# Patient Record
Sex: Male | Born: 1950 | Race: White | Hispanic: No | Smoking: Never smoker
Health system: Southern US, Community
[De-identification: ages and names within clinical notes are randomized; demographics above are authoritative.]

## PROBLEM LIST (undated history)

## (undated) DIAGNOSIS — Z91038 Other insect allergy status: Secondary | ICD-10-CM

## (undated) DIAGNOSIS — E785 Hyperlipidemia, unspecified: Secondary | ICD-10-CM

## (undated) DIAGNOSIS — I10 Essential (primary) hypertension: Secondary | ICD-10-CM

## (undated) HISTORY — PX: APPENDECTOMY: SHX54

## (undated) HISTORY — DX: Essential (primary) hypertension: I10

## (undated) HISTORY — PX: VASECTOMY: SHX75

## (undated) HISTORY — DX: Hyperlipidemia, unspecified: E78.5

## (undated) HISTORY — PX: CHOLECYSTECTOMY: SHX55

## (undated) HISTORY — DX: Other insect allergy status: Z91.038

---

## 2014-01-17 DIAGNOSIS — M5416 Radiculopathy, lumbar region: Secondary | ICD-10-CM | POA: Insufficient documentation

## 2016-11-05 HISTORY — PX: COLONOSCOPY: SHX174

## 2017-05-29 DIAGNOSIS — R0789 Other chest pain: Secondary | ICD-10-CM | POA: Diagnosis not present

## 2017-05-29 DIAGNOSIS — I10 Essential (primary) hypertension: Secondary | ICD-10-CM | POA: Diagnosis not present

## 2017-05-29 DIAGNOSIS — R0989 Other specified symptoms and signs involving the circulatory and respiratory systems: Secondary | ICD-10-CM | POA: Diagnosis not present

## 2017-05-30 DIAGNOSIS — R0789 Other chest pain: Secondary | ICD-10-CM | POA: Diagnosis not present

## 2017-05-30 DIAGNOSIS — R0989 Other specified symptoms and signs involving the circulatory and respiratory systems: Secondary | ICD-10-CM | POA: Diagnosis not present

## 2017-05-30 DIAGNOSIS — I10 Essential (primary) hypertension: Secondary | ICD-10-CM | POA: Diagnosis not present

## 2017-12-15 DIAGNOSIS — H6240 Otitis externa in other diseases classified elsewhere, unspecified ear: Secondary | ICD-10-CM | POA: Diagnosis not present

## 2017-12-15 DIAGNOSIS — B369 Superficial mycosis, unspecified: Secondary | ICD-10-CM | POA: Diagnosis not present

## 2017-12-15 DIAGNOSIS — H6091 Unspecified otitis externa, right ear: Secondary | ICD-10-CM | POA: Diagnosis not present

## 2017-12-23 DIAGNOSIS — B369 Superficial mycosis, unspecified: Secondary | ICD-10-CM | POA: Diagnosis not present

## 2017-12-23 DIAGNOSIS — H6240 Otitis externa in other diseases classified elsewhere, unspecified ear: Secondary | ICD-10-CM | POA: Diagnosis not present

## 2018-03-16 DIAGNOSIS — Z23 Encounter for immunization: Secondary | ICD-10-CM | POA: Diagnosis not present

## 2018-08-01 DIAGNOSIS — R739 Hyperglycemia, unspecified: Secondary | ICD-10-CM | POA: Diagnosis not present

## 2018-08-01 DIAGNOSIS — E782 Mixed hyperlipidemia: Secondary | ICD-10-CM | POA: Diagnosis not present

## 2018-08-01 DIAGNOSIS — I10 Essential (primary) hypertension: Secondary | ICD-10-CM | POA: Diagnosis not present

## 2018-08-01 DIAGNOSIS — Z79899 Other long term (current) drug therapy: Secondary | ICD-10-CM | POA: Diagnosis not present

## 2018-08-01 DIAGNOSIS — Z0000 Encounter for general adult medical examination without abnormal findings: Secondary | ICD-10-CM | POA: Diagnosis not present

## 2019-03-06 DIAGNOSIS — L578 Other skin changes due to chronic exposure to nonionizing radiation: Secondary | ICD-10-CM | POA: Diagnosis not present

## 2019-03-06 DIAGNOSIS — L821 Other seborrheic keratosis: Secondary | ICD-10-CM | POA: Diagnosis not present

## 2019-03-06 DIAGNOSIS — L570 Actinic keratosis: Secondary | ICD-10-CM | POA: Diagnosis not present

## 2019-03-23 DIAGNOSIS — Z23 Encounter for immunization: Secondary | ICD-10-CM | POA: Diagnosis not present

## 2019-06-19 DIAGNOSIS — L820 Inflamed seborrheic keratosis: Secondary | ICD-10-CM | POA: Diagnosis not present

## 2019-08-03 DIAGNOSIS — R739 Hyperglycemia, unspecified: Secondary | ICD-10-CM | POA: Diagnosis not present

## 2019-08-03 DIAGNOSIS — Z0000 Encounter for general adult medical examination without abnormal findings: Secondary | ICD-10-CM | POA: Diagnosis not present

## 2019-08-03 DIAGNOSIS — Z79899 Other long term (current) drug therapy: Secondary | ICD-10-CM | POA: Diagnosis not present

## 2019-08-03 DIAGNOSIS — E782 Mixed hyperlipidemia: Secondary | ICD-10-CM | POA: Diagnosis not present

## 2019-08-03 DIAGNOSIS — I10 Essential (primary) hypertension: Secondary | ICD-10-CM | POA: Diagnosis not present

## 2019-08-03 DIAGNOSIS — Z1331 Encounter for screening for depression: Secondary | ICD-10-CM | POA: Diagnosis not present

## 2019-08-03 DIAGNOSIS — Z131 Encounter for screening for diabetes mellitus: Secondary | ICD-10-CM | POA: Diagnosis not present

## 2019-08-03 DIAGNOSIS — Z9181 History of falling: Secondary | ICD-10-CM | POA: Diagnosis not present

## 2019-10-29 DIAGNOSIS — H26492 Other secondary cataract, left eye: Secondary | ICD-10-CM | POA: Diagnosis not present

## 2020-01-09 DIAGNOSIS — M5416 Radiculopathy, lumbar region: Secondary | ICD-10-CM | POA: Diagnosis not present

## 2020-01-09 DIAGNOSIS — M48061 Spinal stenosis, lumbar region without neurogenic claudication: Secondary | ICD-10-CM | POA: Diagnosis not present

## 2020-01-09 DIAGNOSIS — I10 Essential (primary) hypertension: Secondary | ICD-10-CM | POA: Diagnosis not present

## 2020-01-09 DIAGNOSIS — Z6827 Body mass index (BMI) 27.0-27.9, adult: Secondary | ICD-10-CM | POA: Diagnosis not present

## 2020-02-12 DIAGNOSIS — M545 Low back pain: Secondary | ICD-10-CM | POA: Diagnosis not present

## 2020-02-12 DIAGNOSIS — M5416 Radiculopathy, lumbar region: Secondary | ICD-10-CM | POA: Diagnosis not present

## 2020-02-17 DIAGNOSIS — Z87442 Personal history of urinary calculi: Secondary | ICD-10-CM | POA: Diagnosis not present

## 2020-02-17 DIAGNOSIS — K7689 Other specified diseases of liver: Secondary | ICD-10-CM | POA: Diagnosis not present

## 2020-02-17 DIAGNOSIS — N201 Calculus of ureter: Secondary | ICD-10-CM | POA: Diagnosis not present

## 2020-02-17 DIAGNOSIS — N281 Cyst of kidney, acquired: Secondary | ICD-10-CM | POA: Diagnosis not present

## 2020-02-17 DIAGNOSIS — R1032 Left lower quadrant pain: Secondary | ICD-10-CM | POA: Diagnosis not present

## 2020-02-17 DIAGNOSIS — N132 Hydronephrosis with renal and ureteral calculous obstruction: Secondary | ICD-10-CM | POA: Diagnosis not present

## 2020-03-17 DIAGNOSIS — M47816 Spondylosis without myelopathy or radiculopathy, lumbar region: Secondary | ICD-10-CM | POA: Diagnosis not present

## 2020-03-17 DIAGNOSIS — M48061 Spinal stenosis, lumbar region without neurogenic claudication: Secondary | ICD-10-CM | POA: Diagnosis not present

## 2020-03-20 ENCOUNTER — Other Ambulatory Visit: Payer: Self-pay | Admitting: Physical Medicine and Rehabilitation

## 2020-03-20 DIAGNOSIS — M48061 Spinal stenosis, lumbar region without neurogenic claudication: Secondary | ICD-10-CM

## 2020-03-24 ENCOUNTER — Telehealth: Payer: Self-pay

## 2020-03-24 NOTE — Telephone Encounter (Signed)
Spoke with patient to review his medications and explain the lumbar myelogram procedure to him.  He states an understanding he will be here two hours, will need a driver and will need to be on strict bedrest (explained) after the procedure for 24 hours.  He has no medications to hold.

## 2020-03-27 ENCOUNTER — Ambulatory Visit
Admission: RE | Admit: 2020-03-27 | Discharge: 2020-03-27 | Disposition: A | Discharge status: Home / Self Care | Payer: Medicare Other | Source: Ambulatory Visit | Attending: Physical Medicine and Rehabilitation | Admitting: Physical Medicine and Rehabilitation

## 2020-03-27 VITALS — BP 154/68 | HR 59

## 2020-03-27 DIAGNOSIS — M5416 Radiculopathy, lumbar region: Secondary | ICD-10-CM | POA: Diagnosis not present

## 2020-03-27 DIAGNOSIS — M5126 Other intervertebral disc displacement, lumbar region: Secondary | ICD-10-CM | POA: Diagnosis not present

## 2020-03-27 DIAGNOSIS — M48061 Spinal stenosis, lumbar region without neurogenic claudication: Secondary | ICD-10-CM

## 2020-03-27 DIAGNOSIS — M5136 Other intervertebral disc degeneration, lumbar region: Secondary | ICD-10-CM | POA: Diagnosis not present

## 2020-03-27 MED ORDER — DIAZEPAM 5 MG PO TABS
5.0000 mg | ORAL_TABLET | Freq: Once | ORAL | Status: AC
  Administered 2020-03-27: 5 mg via ORAL

## 2020-03-27 MED ORDER — IOPAMIDOL (ISOVUE-M 200) INJECTION 41%
18.0000 mL | Freq: Once | INTRAMUSCULAR | Status: AC
  Administered 2020-03-27: 18 mL via INTRATHECAL

## 2020-03-27 NOTE — Discharge Instructions (Signed)
Myelogram Discharge Instructions  1. Go home and rest quietly for the next 24 hours.  It is important to lie flat for the next 24 hours.  Get up only to go to the restroom.  You may lie in the bed or on a couch on your back, your stomach, your left side or your right side.  You may have one pillow under your head.  You may have pillows between your knees while you are on your side or under your knees while you are on your back.  2. DO NOT drive today.  Recline the seat as far back as it will go, while still wearing your seat belt, on the way home.  3. You may get up to go to the bathroom as needed.  You may sit up for 15-20 minutes to eat.  You may resume your normal diet and medications unless otherwise indicated.  Drink lots of extra fluids today and tomorrow.  4. The incidence of headache, nausea, or vomiting is about 5% (one in 20 patients).  If you develop a headache, lie flat and drink plenty of fluids until the headache goes away.  Caffeinated beverages may be helpful.  If you develop severe nausea and vomiting or a headache that does not go away with flat bed rest, call [phone].  5. You may resume normal activities after your 24 hours of bed rest is over; however, do not exert yourself strongly or do any heavy lifting tomorrow. If when you get up you have a headache when standing, go back to bed and force fluids for another 24 hours.  6. Call your physician for a follow-up appointment.  The results of your myelogram will be sent directly to your physician by the following day.  7. If you have any questions or if complications develop after you arrive home, please call [phone].  Discharge instructions have been explained to the patient.  The patient, or the person responsible for the patient, fully understands these instructions

## 2020-04-09 DIAGNOSIS — M25559 Pain in unspecified hip: Secondary | ICD-10-CM | POA: Diagnosis not present

## 2020-04-09 DIAGNOSIS — Z6827 Body mass index (BMI) 27.0-27.9, adult: Secondary | ICD-10-CM | POA: Diagnosis not present

## 2020-04-09 DIAGNOSIS — I10 Essential (primary) hypertension: Secondary | ICD-10-CM | POA: Diagnosis not present

## 2020-04-14 DIAGNOSIS — M25551 Pain in right hip: Secondary | ICD-10-CM | POA: Diagnosis not present

## 2020-04-19 DIAGNOSIS — M25551 Pain in right hip: Secondary | ICD-10-CM | POA: Diagnosis not present

## 2020-04-29 DIAGNOSIS — M25551 Pain in right hip: Secondary | ICD-10-CM | POA: Diagnosis not present

## 2020-05-12 DIAGNOSIS — M25551 Pain in right hip: Secondary | ICD-10-CM | POA: Diagnosis not present

## 2020-08-04 DIAGNOSIS — E782 Mixed hyperlipidemia: Secondary | ICD-10-CM | POA: Diagnosis not present

## 2020-08-04 DIAGNOSIS — Z79899 Other long term (current) drug therapy: Secondary | ICD-10-CM | POA: Diagnosis not present

## 2020-08-04 DIAGNOSIS — Z125 Encounter for screening for malignant neoplasm of prostate: Secondary | ICD-10-CM | POA: Diagnosis not present

## 2020-08-05 DIAGNOSIS — E782 Mixed hyperlipidemia: Secondary | ICD-10-CM | POA: Diagnosis not present

## 2020-08-05 DIAGNOSIS — Z0000 Encounter for general adult medical examination without abnormal findings: Secondary | ICD-10-CM | POA: Diagnosis not present

## 2020-08-05 DIAGNOSIS — K219 Gastro-esophageal reflux disease without esophagitis: Secondary | ICD-10-CM | POA: Diagnosis not present

## 2020-08-05 DIAGNOSIS — Z6827 Body mass index (BMI) 27.0-27.9, adult: Secondary | ICD-10-CM | POA: Diagnosis not present

## 2020-08-05 DIAGNOSIS — I10 Essential (primary) hypertension: Secondary | ICD-10-CM | POA: Diagnosis not present

## 2021-01-13 DIAGNOSIS — L821 Other seborrheic keratosis: Secondary | ICD-10-CM | POA: Diagnosis not present

## 2021-01-13 DIAGNOSIS — L578 Other skin changes due to chronic exposure to nonionizing radiation: Secondary | ICD-10-CM | POA: Diagnosis not present

## 2021-01-13 DIAGNOSIS — L570 Actinic keratosis: Secondary | ICD-10-CM | POA: Diagnosis not present

## 2021-01-13 DIAGNOSIS — L820 Inflamed seborrheic keratosis: Secondary | ICD-10-CM | POA: Diagnosis not present

## 2021-04-21 DIAGNOSIS — Z23 Encounter for immunization: Secondary | ICD-10-CM | POA: Diagnosis not present

## 2022-08-13 IMAGING — XA DG MYELOGRAPHY LUMBAR INJ LUMBOSACRAL
11 series · 11 of 11 positions shown · non-contrast
Comparison: MRI 02/12/2020

CLINICAL DATA: Low back and right radicular pain.
TECHNIQUE: Contiguous axial images were obtained through the Lumbar spine after
the intrathecal infusion of infusion. Coronal and sagittal
reconstructions were obtained of the axial image sets.

[Series 1: vasc adipose · 1 of 1 slices shown (1 of 8)]
[im 1/1]
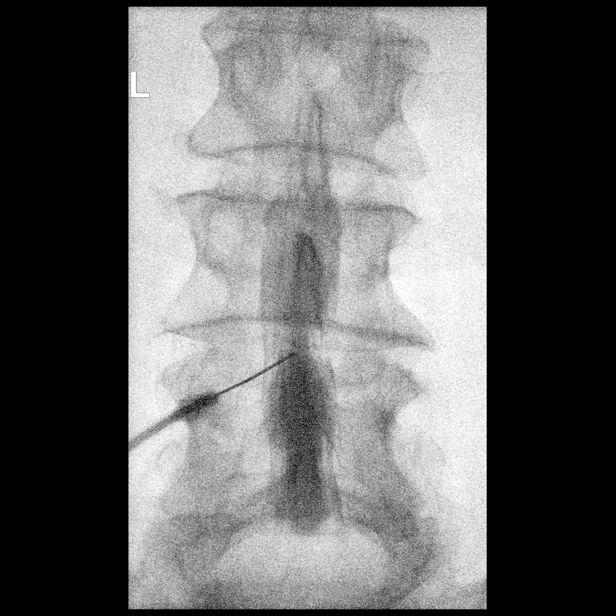

[Series 1: w lumbar spine lat · 0.15mm/px · 1 of 1 slices shown]
[im 1/1]
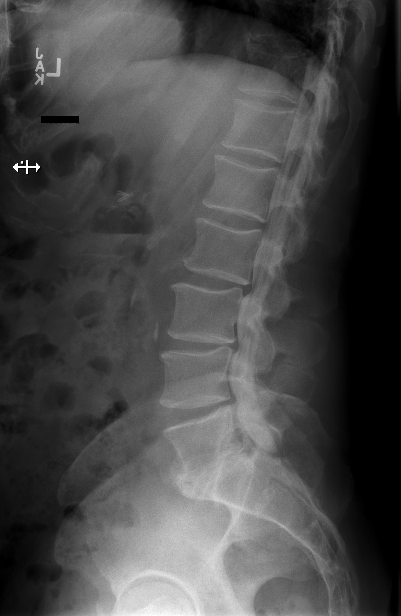

[Series 2: vasc adipose · 1 of 1 slices shown (2 of 8)]
[im 1/1]
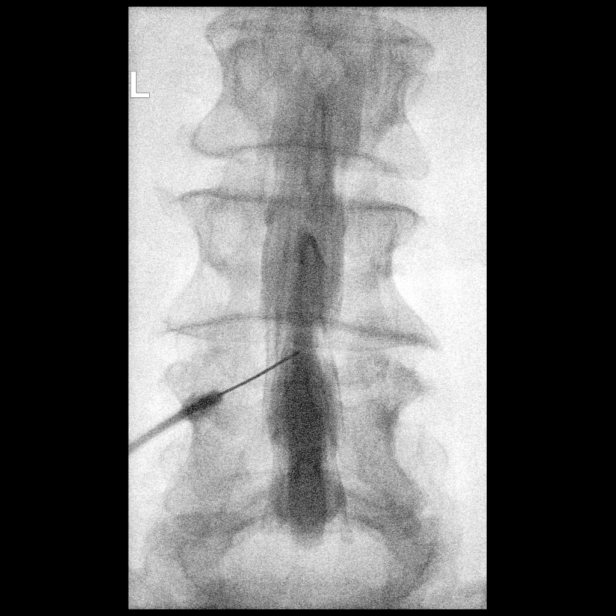

[Series 2: w lumbar spine flexion · 0.15mm/px · 1 of 1 slices shown]
[im 1/1]
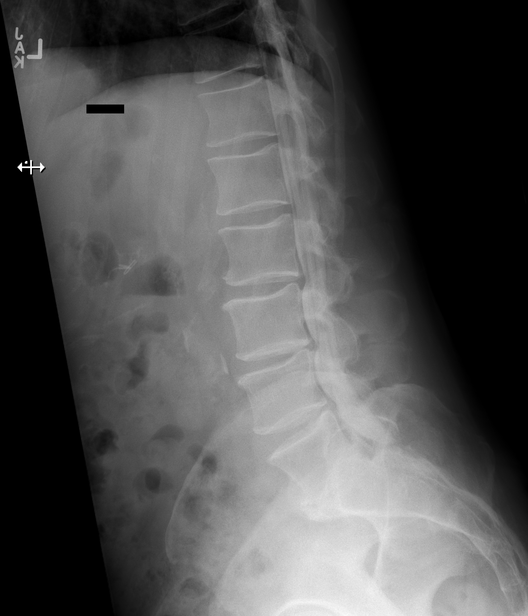

[Series 3: w lumbar spine extension · 0.15mm/px · 1 of 1 slices shown]
[im 1/1]
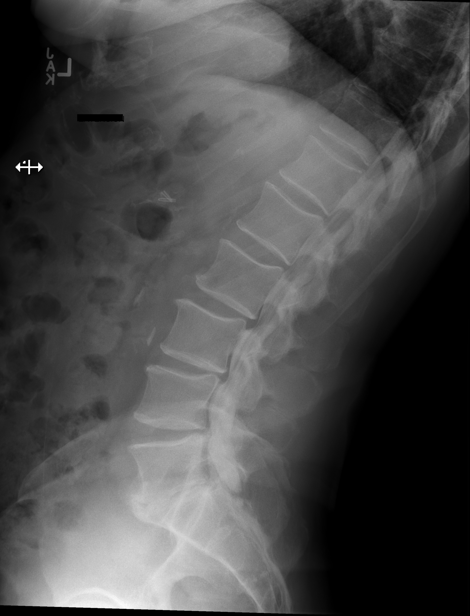

[Series 3: vasc adipose · 1 of 1 slices shown (3 of 8)]
[im 1/1]
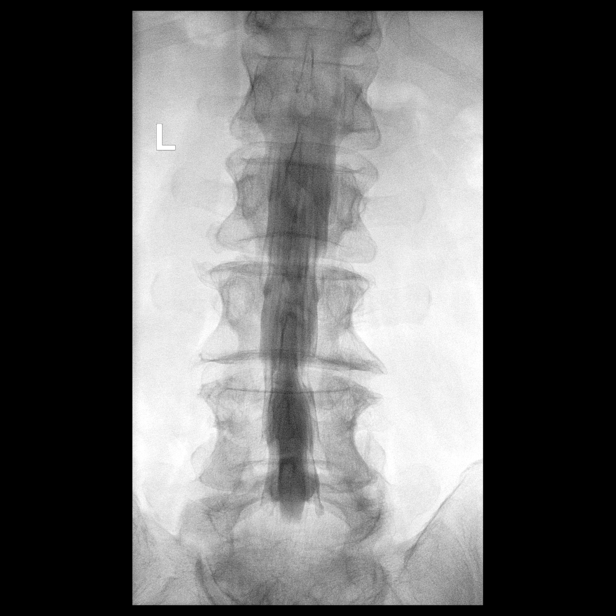

[Series 4: vasc adipose · 1 of 1 slices shown (4 of 8)]
[im 1/1]
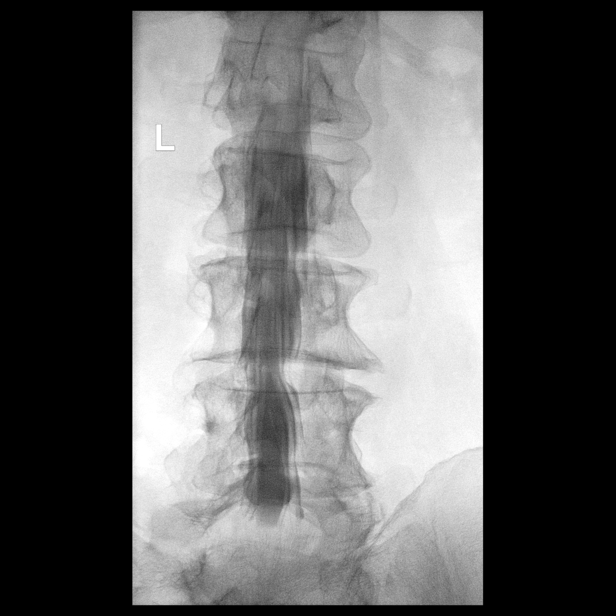

[Series 5: vasc adipose · 1 of 1 slices shown (5 of 8)]
[im 1/1]
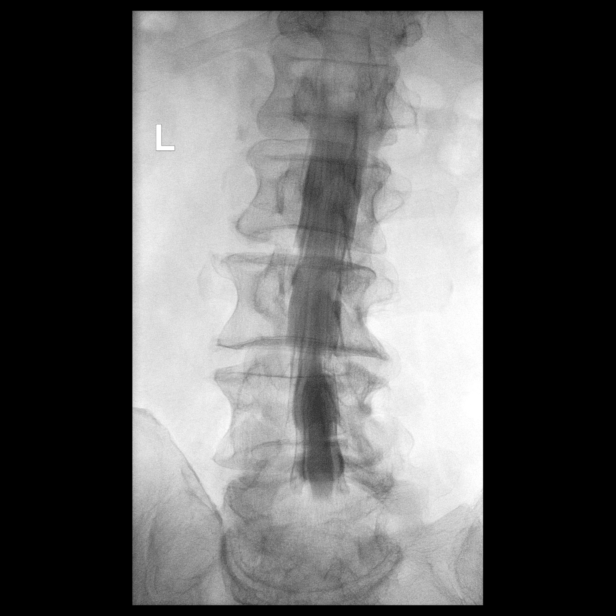

[Series 6: vasc adipose · 1 of 1 slices shown (6 of 8)]
[im 1/1]
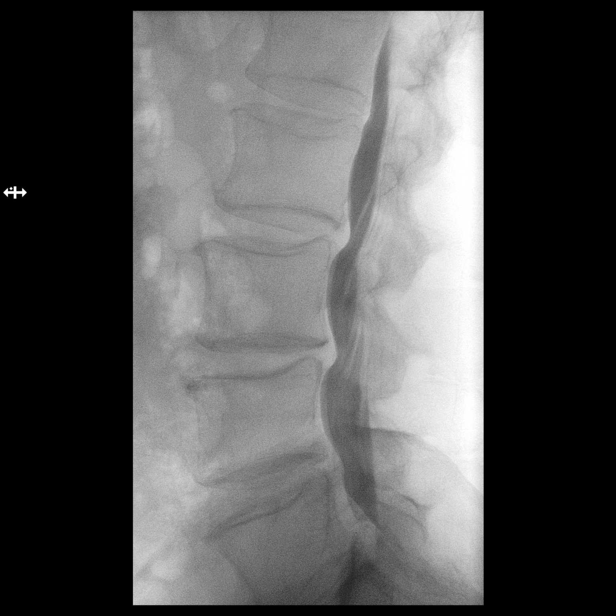

[Series 7: vasc adipose · 1 of 1 slices shown (7 of 8)]
[im 1/1]
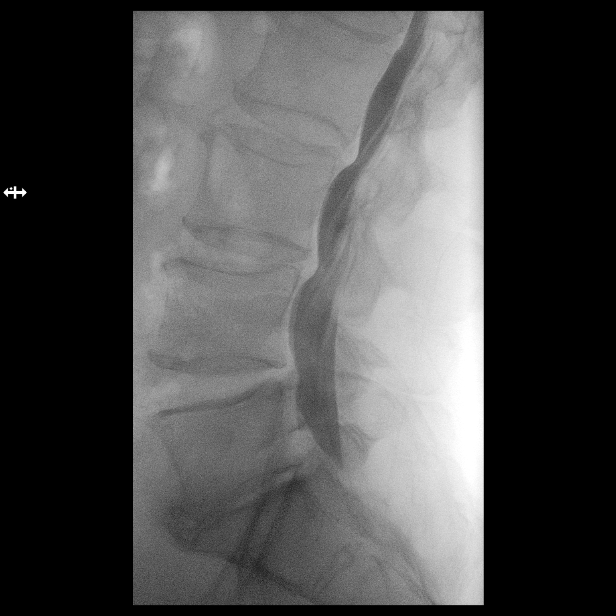

[Series 8: vasc adipose · 1 of 1 slices shown (8 of 8)]
[im 1/1]
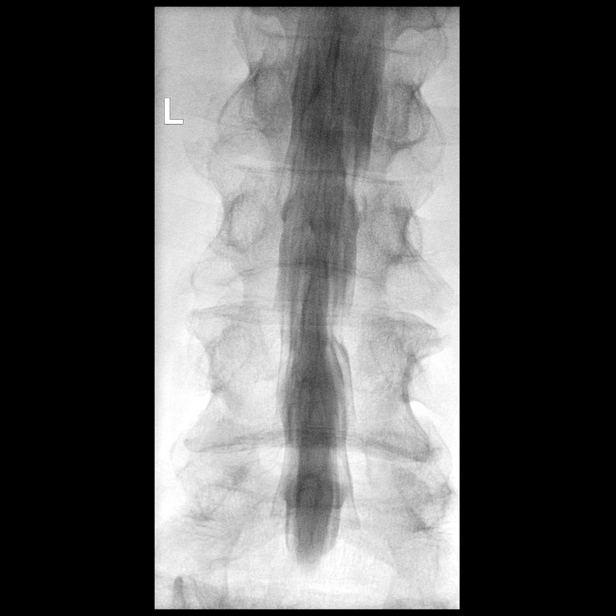

[11 of 11 positions shown; findings below may reference images not displayed]

EXAM:
LUMBAR MYELOGRAM

FLUOROSCOPY TIME:  0 minutes 42 seconds. 125.88 micro gray meter
squared

PROCEDURE:
After thorough discussion of risks and benefits of the procedure
including bleeding, infection, injury to nerves, blood vessels,
adjacent structures as well as headache and CSF leak, written and
oral informed consent was obtained. Consent was obtained by Dr. Ted
Moud. Time out form was completed.

Patient was positioned prone on the fluoroscopy table. Local
anesthesia was provided with 1% lidocaine without epinephrine after
prepped and draped in the usual sterile fashion. Puncture was
performed at L4-5 using a 3 1/2 inch 22-gauge spinal needle via left
paramedian approach. Using a single pass through the dura, the
needle was placed within the thecal sac, with return of clear CSF.
15 mL of Isovue 0-5ZZ was injected into the thecal sac, with normal
opacification of the nerve roots and cauda equina consistent with
free flow within the subarachnoid space.

I personally performed the lumbar puncture and administered the
intrathecal contrast. I also personally performed acquisition of the
myelogram images.
FINDINGS: LUMBAR MYELOGRAM FINDINGS:

Anterior extradural defects on the right at L3-4 and L4-5, more
pronounced at L3-4. Anterior extradural defects at L2-3, L3-4 and
L4-5. Standing flexion extension views do not show any subluxation.
Disc space narrowing at L5-S1 without apparent encroachment upon the
neural structures.

CT LUMBAR MYELOGRAM FINDINGS:

T11-12 through L1-2: Normal.

L2-3: Desiccation and bulging of the disc more prominent towards the
left. No apparent compressive stenosis.

L3-4: Endplate osteophytes and bulging of the disc more prominent
towards the right. Stenosis of the right lateral recess that could
compress the right L4 nerve. Mild right foraminal narrowing.

L4-5: Endplate osteophytes and bulging of the disc more prominent
towards the right. Stenosis of both lateral recesses right more than
left. Either L5 nerve could be affected, more likely the right. Bony
foraminal narrowing on the right that could affect the exiting L4
nerve.

L5-S1: Chronic disc degeneration with loss disc height. Endplate
osteophytes. No canal stenosis. Minimal foraminal encroachment on
the right by osteophytes. Mild to moderate foraminal encroachment on
the left by osteophytes.

Mild bilateral sacroiliac osteoarthritis is noted.
IMPRESSION: L2-3: Disc bulge more prominent towards the left, but without
apparent gross neural compression.

L3-4: Osteophytes and disc bulge more prominent towards the right.
Stenosis of the right lateral recess that could compress the right
L4 nerve.

L4-5: Osteophytes and disc bulge slightly more prominent towards the
right. Stenosis of both lateral recesses right more than left.
Either L5 nerve could be compressed, more likely the right. Bony
foraminal narrowing on the right could affect the exiting L4 nerve
in addition.

L5-S1: Chronic disc degeneration with loss of disc height. No canal
stenosis. Mild foraminal encroachment left more than right.

## 2022-12-10 ENCOUNTER — Encounter: Payer: Self-pay | Admitting: Gastroenterology

## 2022-12-17 ENCOUNTER — Encounter: Payer: Self-pay | Admitting: Gastroenterology

## 2023-01-12 ENCOUNTER — Ambulatory Visit: Payer: Medicare HMO

## 2023-01-12 VITALS — Ht 70.0 in | Wt 162.0 lb

## 2023-01-12 DIAGNOSIS — Z1211 Encounter for screening for malignant neoplasm of colon: Secondary | ICD-10-CM

## 2023-01-12 DIAGNOSIS — Z86010 Personal history of colonic polyps: Secondary | ICD-10-CM

## 2023-01-12 MED ORDER — NA SULFATE-K SULFATE-MG SULF 17.5-3.13-1.6 GM/177ML PO SOLN: 1.0000 | Freq: Once | ORAL | 0 refills | Status: AC

## 2023-01-12 NOTE — Progress Notes (Signed)
No egg or soy allergy known to patient  No issues known to pt with past sedation with any surgeries or procedures Patient denies ever being told they had issues or difficulty with intubation  No FH of Malignant Hyperthermia Pt is not on diet pills Pt is not on  home 02  Pt is not on blood thinners  Pt denies issues with constipation  No A fib or A flutter Have any cardiac testing pending--no  LOA: impendent   Prep: suprep  Patient's chart reviewed by Cathlyn Parsons CNRA prior to previsit and patient appropriate for the [facility].  Previsit completed and red dot placed by patient's name on their procedure day (on provider's schedule).     PV competed with patient. Prep instructions sent via mychart and home address. Goodrx coupon for [employer] provided to use for price reduction if needed.

## 2023-01-18 ENCOUNTER — Encounter: Payer: Self-pay | Admitting: Gastroenterology

## 2023-01-19 ENCOUNTER — Telehealth: Payer: Self-pay | Admitting: Gastroenterology

## 2023-01-19 NOTE — Telephone Encounter (Signed)
Inbound call from patient requesting a call back to discuss coding of 8/28 colonoscopy. Please advise, thank you.

## 2023-01-20 NOTE — Telephone Encounter (Signed)
Unable to access records Please do it as screening colonoscopy RG

## 2023-01-20 NOTE — Telephone Encounter (Signed)
That goes to previsit

## 2023-01-20 NOTE — Telephone Encounter (Signed)
Pt is requesting a call back from a nurse. Pt thinks his colonoscopy needs to be coded as preventative since he states he does not have a history of polyps.

## 2023-01-24 NOTE — Telephone Encounter (Signed)
PT needs to have procedure coded as a screen per Dr. Chales Abrahams request. Please advise

## 2023-01-24 NOTE — Telephone Encounter (Signed)
Shonpryl, Please forward this message to the pre cert employee- the patient has already completed the PV appt on 01/12/2023- ask Lady Gary if you are needed to complete additional tasks related to this Thank you Bre, PV RN

## 2023-01-27 ENCOUNTER — Encounter: Payer: Self-pay | Admitting: Gastroenterology

## 2023-01-27 NOTE — Telephone Encounter (Signed)
Per colon report in 2018 patient was seen for HOCP 1 polyp was removed at that time I am unable to see any path results. Per Dr. Chales Abrahams I have went in and updated the referral to screening due to no clarification of polyp type.

## 2023-01-27 NOTE — Addendum Note (Signed)
Addended by: Darylene Price on: 01/27/2023 02:59 PM   Modules accepted: Orders

## 2023-02-02 ENCOUNTER — Encounter: Payer: Medicare Other | Admitting: Gastroenterology

## 2023-03-13 ENCOUNTER — Encounter: Payer: Self-pay | Admitting: Certified Registered Nurse Anesthetist

## 2023-03-14 ENCOUNTER — Telehealth: Payer: Self-pay | Admitting: Gastroenterology

## 2023-03-14 NOTE — Telephone Encounter (Signed)
Updated instructions sent to patient via MyChart per his request;

## 2023-03-14 NOTE — Telephone Encounter (Signed)
Inbound call from patient stating he has not received any prep instructions for 10/14 colonoscopy. Patient requesting instructions to be sent through mychart. Please advise, thank you.

## 2023-03-17 ENCOUNTER — Encounter: Payer: Self-pay | Admitting: Gastroenterology

## 2023-03-21 ENCOUNTER — Encounter: Payer: Self-pay | Admitting: Gastroenterology

## 2023-03-21 ENCOUNTER — Ambulatory Visit: Payer: Medicare HMO | Admitting: Gastroenterology

## 2023-03-21 VITALS — BP 125/65 | HR 65 | Temp 98.2°F | Resp 11 | Ht 70.0 in | Wt 162.0 lb

## 2023-03-21 DIAGNOSIS — D122 Benign neoplasm of ascending colon: Secondary | ICD-10-CM | POA: Diagnosis not present

## 2023-03-21 DIAGNOSIS — Z1211 Encounter for screening for malignant neoplasm of colon: Secondary | ICD-10-CM

## 2023-03-21 MED ORDER — SODIUM CHLORIDE 0.9 % IV SOLN: 500.0000 mL | INTRAVENOUS | Status: DC

## 2023-03-21 NOTE — Progress Notes (Signed)
[facility] History and Physical   Primary Care Physician:  Buckner Malta, MD   Reason for Procedure:   CRC screening  Plan:    colon     HPI: Tyler Cannon is a 72 y.o. male    Past Medical History:  Diagnosis Date   Hyperlipidemia    Hypertension     Past Surgical History:  Procedure Laterality Date   APPENDECTOMY     COLONOSCOPY  11/2016   Dr. Chales Abrahams    Prior to Admission medications   Medication Sig Start Date End Date Taking? Authorizing Provider  APPLE CIDER VINEGAR PO Take 1,600 mg by mouth daily. With mother (unfiltered)    [provider]  Multiple Vitamin (DAILY VITAMINS) tablet Take 1 tablet by mouth daily.    [provider]  omega-3 acid ethyl esters (LOVAZA) 1 g capsule Take 2 capsules by mouth 2 (two) times daily. 01/01/23   [provider]  pantoprazole (PROTONIX) 40 MG tablet Take 40 mg by mouth daily as needed.    [provider]  Saw Palmetto 450 MG CAPS Take 1 capsule by mouth daily.    [provider]  simvastatin (ZOCOR) 40 MG tablet Take 20 mg by mouth daily at 6 PM.     [provider]  telmisartan (MICARDIS) 40 MG tablet Take 40 mg by mouth daily.    [provider]    Current Outpatient Medications  Medication Sig Dispense Refill   APPLE CIDER VINEGAR PO Take 1,600 mg by mouth daily. With mother (unfiltered)     Multiple Vitamin (DAILY VITAMINS) tablet Take 1 tablet by mouth daily.     omega-3 acid ethyl esters (LOVAZA) 1 g capsule Take 2 capsules by mouth 2 (two) times daily.     pantoprazole (PROTONIX) 40 MG tablet Take 40 mg by mouth daily as needed.     Saw Palmetto 450 MG CAPS Take 1 capsule by mouth daily.     simvastatin (ZOCOR) 40 MG tablet Take 20 mg by mouth daily at 6 PM.      telmisartan (MICARDIS) 40 MG tablet Take 40 mg by mouth daily.     Current Facility-Administered Medications  Medication Dose Route Frequency Provider Last Rate Last Admin   0.9 %   sodium chloride infusion  500 mL Intravenous Continuous Lynann Bologna, MD        Allergies as of 03/21/2023 - Review Complete 03/21/2023  Allergen Reaction Noted   Ibuprofen Rash 03/21/2020    Family History  Problem Relation Age of Onset   Colon cancer Neg Hx    Colon polyps Neg Hx    Esophageal cancer Neg Hx    Rectal cancer Neg Hx    Stomach cancer Neg Hx     Social History   Socioeconomic History   Marital status: Widowed    Spouse name: Not on file   Number of children: Not on file   Years of education: Not on file   Highest education level: Not on file  Occupational History   Not on file  Tobacco Use   Smoking status: Never   Smokeless tobacco: Never  Vaping Use   Vaping status: Never Used  Substance and Sexual Activity   Alcohol use: Not Currently   Drug use: Never   Sexual activity: Not on file  Other Topics Concern   Not on file  Social History Narrative   Not on file   Social Determinants of Health   Financial Resource Strain:  Not on file  Food Insecurity: Not on file  Transportation Needs: Not on file  Physical Activity: Not on file  Stress: Not on file  Social Connections: Not on file  Intimate Partner Violence: Not on file    Review of Systems: Positive for none All other review of systems negative except as mentioned in the HPI.  Physical Exam: Vital signs in last 24 hours: @VSRANGES @   General:   Alert,  Well-developed, well-nourished, pleasant and cooperative in NAD Lungs:  Clear throughout to auscultation.   Heart:  Regular rate and rhythm; no murmurs, clicks, rubs,  or gallops. Abdomen:  Soft, nontender and nondistended. Normal bowel sounds.   Neuro/Psych:  Alert and cooperative. Normal mood and affect. A and O x 3    No significant changes were identified.  The patient continues to be an appropriate candidate for the planned procedure and anesthesia.   Edman Circle, MD. [facility] 03/21/2023 3:19 PM@

## 2023-03-21 NOTE — Progress Notes (Signed)
Report given to PACU, vss 

## 2023-03-21 NOTE — Progress Notes (Signed)
Called to room to assist during endoscopic procedure.  Patient ID and intended procedure confirmed with present staff. Received instructions for my participation in the procedure from the performing physician.  

## 2023-03-21 NOTE — Patient Instructions (Addendum)
High fiber diet. Continue present medications. Await pathology results. Repeat colonoscopy for surveillance based on pathology results. Likely not needed d/t age.  Please read over handouts given                           YOU HAD AN ENDOSCOPIC PROCEDURE TODAY AT THE [facility]:   Refer to the procedure report that was given to you for any specific questions about what was found during the examination.  If the procedure report does not answer your questions, please call your gastroenterologist to clarify.  If you requested that your care partner not be given the details of your procedure findings, then the procedure report has been included in a sealed envelope for you to review at your convenience later.  YOU SHOULD EXPECT: Some feelings of bloating in the abdomen. Passage of more gas than usual.  Walking can help get rid of the air that was put into your GI tract during the procedure and reduce the bloating. If you had a lower endoscopy (such as a colonoscopy or flexible sigmoidoscopy) you may notice spotting of blood in your stool or on the toilet paper. If you underwent a bowel prep for your procedure, you may not have a normal bowel movement for a few days.  Please Note:  You might notice some irritation and congestion in your nose or some drainage.  This is from the oxygen used during your procedure.  There is no need for concern and it should clear up in a day or so.  SYMPTOMS TO REPORT IMMEDIATELY:  Following lower endoscopy (colonoscopy or flexible sigmoidoscopy):  Excessive amounts of blood in the stool  Significant tenderness or worsening of abdominal pains  Swelling of the abdomen that is new, acute  Fever of 100F or higher For urgent or emergent issues, a gastroenterologist can be reached at any hour by calling [phone]. Do not use MyChart messaging for urgent concerns.    DIET:  We do recommend a small meal at first, but then you may proceed to your regular  diet.  Drink plenty of fluids but you should avoid alcoholic beverages for 24 hours.  ACTIVITY:  You should plan to take it easy for the rest of today and you should NOT DRIVE or use heavy machinery until tomorrow (because of the sedation medicines used during the test).    FOLLOW UP: Our staff will call the number listed on your records the next business day following your procedure.  We will call around 7:15- 8:00 am to check on you and address any questions or concerns that you may have regarding the information given to you following your procedure. If we do not reach you, we will leave a message.     If any biopsies were taken you will be contacted by phone or by letter within the next 1-3 weeks.  Please call us at [phone] if you have not heard about the biopsies in 3 weeks.    SIGNATURES/CONFIDENTIALITY: You and/or your care partner have signed paperwork which will be entered into your electronic medical record.  These signatures attest to the fact that that the information above on your After Visit Summary has been reviewed and is understood.  Full responsibility of the confidentiality of this discharge information lies with you and/or your care-partner.

## 2023-03-21 NOTE — Op Note (Signed)
[facility] Patient Name: Tyler Cannon Procedure Date: 03/21/2023 3:22 PM MRN: [id number] Endoscopist: Lynann Bologna , MD, [id number] Age: 72 Referring MD:  Date of Birth: 11/14/1950 Gender: Male Account #: [id number] Procedure:                Colonoscopy Indications:              Screening for colorectal malignant neoplasm. Prev                            colon 2014. Medicines:                Monitored Anesthesia Care Procedure:                Pre-Anesthesia Assessment:                           - Prior to the procedure, a History and Physical                            was performed, and patient medications and                            allergies were reviewed. The patient's tolerance of                            previous anesthesia was also reviewed. The risks                            and benefits of the procedure and the sedation                            options and risks were discussed with the patient.                            All questions were answered, and informed consent                            was obtained. Prior Anticoagulants: The patient has                            taken no anticoagulant or antiplatelet agents. ASA                            Grade Assessment: II - A patient with mild systemic                            disease. After reviewing the risks and benefits,                            the patient was deemed in satisfactory condition to                            undergo the procedure.  After obtaining informed consent, the colonoscope                            was passed under direct vision. Throughout the                            procedure, the patient's blood pressure, pulse, and                            oxygen saturations were monitored continuously. The                            Olympus Scope [id number] was introduced through the                            anus and advanced to the the cecum, identified by                             appendiceal orifice and ileocecal valve. The                            colonoscopy was performed without difficulty. The                            patient tolerated the procedure well. The quality                            of the bowel preparation was good. The ileocecal                            valve, appendiceal orifice, and rectum were                            photographed. Scope In: 3:38:40 PM Scope Out: 3:51:31 PM Scope Withdrawal Time: 0 hours 9 minutes 4 seconds  Total Procedure Duration: 0 hours 12 minutes 51 seconds  Findings:                 A 4 mm polyp was found in the proximal ascending                            colon. The polyp was sessile. The polyp was removed                            with a cold snare. Resection and retrieval were                            complete.                           Multiple medium-mouthed diverticula were found in                            the sigmoid colon and rare in ascending colon.  Non-bleeding internal hemorrhoids were found during                            retroflexion. The hemorrhoids were small and Grade                            I (internal hemorrhoids that do not prolapse).                           The exam was otherwise without abnormality on                            direct and retroflexion views. Complications:            No immediate complications. Estimated Blood Loss:     Estimated blood loss: none. Impression:               - One 4 mm polyp in the proximal ascending colon,                            removed with a cold snare. Resected and retrieved.                           - Moderate sigmoid diverticulosis.                           - Non-bleeding internal hemorrhoids.                           - The examination was otherwise normal on direct                            and retroflexion views. Recommendation:           - Patient has a contact number  available for                            emergencies. The signs and symptoms of potential                            delayed complications were discussed with the                            patient. Return to normal activities tomorrow.                            Written discharge instructions were provided to the                            patient.                           - High fiber diet.                           - Continue present medications.                           -  Await pathology results.                           - Repeat colonoscopy for surveillance based on                            pathology results. Likely not needed d/t age.                           - The findings and recommendations were discussed                            with the patient's family. Lynann Bologna, MD 03/21/2023 3:56:25 PM This report has been signed electronically.

## 2023-03-22 ENCOUNTER — Telehealth: Payer: Self-pay

## 2023-03-22 NOTE — Telephone Encounter (Signed)
  Follow up Call-     03/21/2023    3:15 PM  Call back number  Post procedure Call Back phone  # [phone]  Permission to leave phone message Yes     Patient questions:  Do you have a fever, pain , or abdominal swelling? No. Pain Score  0 *  Have you tolerated food without any problems? Yes.    Have you been able to return to your normal activities? Yes.    Do you have any questions about your discharge instructions: Diet   No. Medications  No. Follow up visit  No.  Do you have questions or concerns about your Care? No.  Actions: * If pain score is 4 or above: No action needed, pain <4.

## 2023-03-24 ENCOUNTER — Encounter: Payer: Self-pay | Admitting: Gastroenterology

## 2023-03-24 LAB — SURGICAL PATHOLOGY

## 2023-03-28 ENCOUNTER — Ambulatory Visit (INDEPENDENT_AMBULATORY_CARE_PROVIDER_SITE_OTHER): Payer: Medicare HMO | Admitting: Allergy and Immunology

## 2023-03-28 ENCOUNTER — Encounter: Payer: Self-pay | Admitting: Allergy and Immunology

## 2023-03-28 ENCOUNTER — Telehealth: Payer: Self-pay

## 2023-03-28 VITALS — BP 138/72 | HR 64 | Resp 16 | Ht 69.3 in | Wt 180.2 lb

## 2023-03-28 DIAGNOSIS — T63481D Toxic effect of venom of other arthropod, accidental (unintentional), subsequent encounter: Secondary | ICD-10-CM

## 2023-03-28 DIAGNOSIS — T782XXD Anaphylactic shock, unspecified, subsequent encounter: Secondary | ICD-10-CM

## 2023-03-28 MED ORDER — EPINEPHRINE 0.3 MG/0.3ML IJ SOAJ: INTRAMUSCULAR | 3 refills | Status: DC

## 2023-03-28 NOTE — Patient Instructions (Signed)
  1. Fire Economist measures  2. Epi-Pen, benadryl, MD/ER evaluation for allergic reaction  3. Change lisinopril to a non-ACE Inhibitor, non-beta blocker medication (primary MD)  4. Blood - fire ant IgE, tryptase  5. Immunotherapy???  6. Obtain fall flu vaccine

## 2023-03-28 NOTE — Telephone Encounter (Signed)
Patient stating he saw you today and his blood pressure he is on is Telmisartin 40 mg once daily.
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
## 2023-03-29 ENCOUNTER — Encounter: Payer: Self-pay | Admitting: Allergy and Immunology

## 2023-03-31 ENCOUNTER — Encounter: Payer: Self-pay | Admitting: *Deleted

## 2023-04-03 LAB — TRYPTASE: Tryptase: 3.3 ug/L (ref 2.2–13.2)

## 2023-04-03 LAB — ALLERGEN FIRE ANT: I070-IgE Fire Ant (Invicta): <0.1 kU/L

## 2023-04-25 ENCOUNTER — Ambulatory Visit: Payer: Medicare HMO | Admitting: Allergy and Immunology

## 2023-04-25 DIAGNOSIS — T782XXD Anaphylactic shock, unspecified, subsequent encounter: Secondary | ICD-10-CM

## 2023-04-25 DIAGNOSIS — T63481D Toxic effect of venom of other arthropod, accidental (unintentional), subsequent encounter: Secondary | ICD-10-CM

## 2023-04-26 ENCOUNTER — Encounter: Payer: Self-pay | Admitting: Allergy and Immunology

## 2023-04-26 NOTE — Progress Notes (Signed)
 Tyler Cannon returns to this clinic for fire ant skin testing.  He demonstrated hypersensitivity against fire ant.  Recommend a course of immunotherapy.

## 2023-10-25 ENCOUNTER — Encounter: Payer: Self-pay | Admitting: Allergy and Immunology

## 2024-04-23 ENCOUNTER — Ambulatory Visit: Payer: Medicare HMO | Admitting: Allergy and Immunology

## 2024-04-23 ENCOUNTER — Encounter: Payer: Self-pay | Admitting: Allergy and Immunology

## 2024-04-23 VITALS — BP 150/80 | HR 60 | Resp 22 | Ht 69.0 in | Wt 191.8 lb

## 2024-04-23 DIAGNOSIS — T782XXD Anaphylactic shock, unspecified, subsequent encounter: Secondary | ICD-10-CM | POA: Diagnosis not present

## 2024-04-23 DIAGNOSIS — T63481D Toxic effect of venom of other arthropod, accidental (unintentional), subsequent encounter: Secondary | ICD-10-CM

## 2024-04-23 MED ORDER — EPINEPHRINE 0.3 MG/0.3ML IJ SOAJ: INTRAMUSCULAR | 3 refills | Status: AC

## 2024-04-23 NOTE — Progress Notes (Unsigned)
 [place] - [place] - [place] - [place] - [place]   Follow-up Note  Referring Provider: Clemmie Nest, MD Primary Provider: Clemmie Nest, MD Date of Office Visit: 04/23/2024  Subjective:   Ezel Vallone (DOB: 1950/10/14) is a 73 y.o. male who [facility] on 04/23/2024 in re-evaluation of the following:  HPI: Charlena returns to this clinic in evaluation of fire  ant hypersensitivity.  I last saw him in this clinic 25 April 2023.  He has not been stung by fire  ants since last seen in this clinic and thus has not had an allergic reaction.  He does have an injectable epinephrine  device.  He has elected to not engage in a course of immunotherapy.  Allergies as of 04/23/2024       Reactions   Ibuprofen Rash   Only with high doses        Medication List    APPLE CIDER VINEGAR PO Take 1,600 mg by mouth daily. With mother (unfiltered)   Daily Vitamins tablet Take 1 tablet by mouth daily.   EPINEPHrine  0.3 mg/0.3 mL Soaj injection Commonly known as: EpiPen  2-Pak Use as directed for life-threatening allergic reaction.   omega-3 acid ethyl esters 1 g capsule Commonly known as: LOVAZA Take 2 capsules by mouth 2 (two) times daily.   pantoprazole 40 MG tablet Commonly known as: PROTONIX Take 40 mg by mouth daily as needed.   Saw Palmetto 450 MG Caps Take 1 capsule by mouth daily.   simvastatin 20 MG tablet Commonly known as: ZOCOR Take 20 mg by mouth at bedtime.   telmisartan 40 MG tablet Commonly known as: MICARDIS Take 40 mg by mouth daily.    Past Medical History:  Diagnosis Date   Hymenoptera allergy     Fire  Ant   Hyperlipidemia    Hypertension     Past Surgical History:  Procedure Laterality Date   APPENDECTOMY     CHOLECYSTECTOMY     COLONOSCOPY  11/2016   Dr. Charlanne   VASECTOMY      Review of systems negative except as noted in HPI / PMHx or noted below:  Review of Systems  Constitutional: Negative.    HENT: Negative.    Eyes: Negative.   Respiratory: Negative.    Cardiovascular: Negative.   Gastrointestinal: Negative.   Genitourinary: Negative.   Musculoskeletal: Negative.   Skin: Negative.   Neurological: Negative.   Endo/Heme/Allergies: Negative.   Psychiatric/Behavioral: Negative.       Objective:   Vitals:   04/23/24 1347  BP: (!) 144/86  Pulse: 60  Resp: (!) 22  SpO2: 98%   Height: 5' 9 (175.3 cm)  Weight: 191 lb 12.8 oz (87 kg)   Physical Exam Constitutional:      Appearance: He is not diaphoretic.  HENT:     Head: Normocephalic.     Right Ear: Tympanic membrane, ear canal and external ear normal.     Left Ear: Tympanic membrane, ear canal and external ear normal.     Nose: Nose normal. No mucosal edema or rhinorrhea.     Mouth/Throat:     Pharynx: Uvula midline. No oropharyngeal exudate.  Eyes:     Conjunctiva/sclera: Conjunctivae normal.  Neck:     Thyroid: No thyromegaly.     Trachea: Trachea normal. No tracheal tenderness or tracheal deviation.  Cardiovascular:     Rate and Rhythm: Normal rate and regular rhythm.     Heart sounds: Normal heart sounds, S1 normal and S2  normal. No murmur heard. Pulmonary:     Effort: No respiratory distress.     Breath sounds: Normal breath sounds. No stridor. No wheezing or rales.  Lymphadenopathy:     Head:     Right side of head: No tonsillar adenopathy.     Left side of head: No tonsillar adenopathy.     Cervical: No cervical adenopathy.  Skin:    Findings: No erythema or rash.     Nails: There is no clubbing.  Neurological:     Mental Status: He is alert.     Diagnostics: none  Assessment and Plan:   1. Anaphylaxis due to hymenoptera venom, accidental or unintentional, subsequent encounter    1. Fire  ant avoidance measures  2. Epi-Pen, benadryl, MD/ER evaluation for allergic reaction  3. Return to clinic in 1 year or earlier if problem  4. Influenza = Tamiflu. Covid = Paxlovid  Charlena appears  to be doing very well and he has elected not to undergo a course of immunotherapy directed against fire  ant and he will continue to carry an EpiPen  during fire  and season and we will see him back in this clinic in 1 year or earlier if there is a problem.  Camellia Denis, MD Allergy  / Immunology [facility]

## 2024-04-23 NOTE — Patient Instructions (Addendum)
  1. Fire  ant avoidance measures  2. Epi-Pen, benadryl, MD/ER evaluation for allergic reaction  3. Return to clinic in 1 year or earlier if problem  4. Influenza = Tamiflu. Covid = Paxlovid

## 2024-04-24 ENCOUNTER — Encounter: Payer: Self-pay | Admitting: Allergy and Immunology
